# Patient Record
Sex: Female | Born: 1960 | Hispanic: No | Marital: Married | Smoking: Never smoker
Health system: Southern US, Community
[De-identification: ages and names within clinical notes are randomized; demographics above are authoritative.]

## PROBLEM LIST (undated history)

## (undated) DIAGNOSIS — I10 Essential (primary) hypertension: Secondary | ICD-10-CM

## (undated) DIAGNOSIS — E119 Type 2 diabetes mellitus without complications: Secondary | ICD-10-CM

---

## 2021-09-27 ENCOUNTER — Emergency Department (HOSPITAL_BASED_OUTPATIENT_CLINIC_OR_DEPARTMENT_OTHER): Payer: PRIVATE HEALTH INSURANCE

## 2021-09-27 ENCOUNTER — Other Ambulatory Visit: Payer: Self-pay

## 2021-09-27 ENCOUNTER — Encounter (HOSPITAL_BASED_OUTPATIENT_CLINIC_OR_DEPARTMENT_OTHER): Payer: Self-pay

## 2021-09-27 ENCOUNTER — Observation Stay (HOSPITAL_BASED_OUTPATIENT_CLINIC_OR_DEPARTMENT_OTHER)
Admission: EM | Admit: 2021-09-27 | Discharge: 2021-09-28 | Disposition: A | Discharge status: Home / Self Care | Payer: PRIVATE HEALTH INSURANCE | Attending: Internal Medicine | Admitting: Internal Medicine

## 2021-09-27 DIAGNOSIS — I639 Cerebral infarction, unspecified: Principal | ICD-10-CM | POA: Insufficient documentation

## 2021-09-27 DIAGNOSIS — Z7984 Long term (current) use of oral hypoglycemic drugs: Secondary | ICD-10-CM | POA: Insufficient documentation

## 2021-09-27 DIAGNOSIS — I10 Essential (primary) hypertension: Secondary | ICD-10-CM | POA: Insufficient documentation

## 2021-09-27 DIAGNOSIS — R55 Syncope and collapse: Secondary | ICD-10-CM | POA: Diagnosis present

## 2021-09-27 DIAGNOSIS — E039 Hypothyroidism, unspecified: Secondary | ICD-10-CM | POA: Diagnosis not present

## 2021-09-27 DIAGNOSIS — E1143 Type 2 diabetes mellitus with diabetic autonomic (poly)neuropathy: Secondary | ICD-10-CM | POA: Diagnosis not present

## 2021-09-27 DIAGNOSIS — Z20822 Contact with and (suspected) exposure to covid-19: Secondary | ICD-10-CM | POA: Diagnosis not present

## 2021-09-27 DIAGNOSIS — E119 Type 2 diabetes mellitus without complications: Secondary | ICD-10-CM | POA: Insufficient documentation

## 2021-09-27 DIAGNOSIS — E871 Hypo-osmolality and hyponatremia: Secondary | ICD-10-CM | POA: Insufficient documentation

## 2021-09-27 DIAGNOSIS — E11649 Type 2 diabetes mellitus with hypoglycemia without coma: Secondary | ICD-10-CM | POA: Diagnosis not present

## 2021-09-27 HISTORY — DX: Essential (primary) hypertension: I10

## 2021-09-27 HISTORY — DX: Type 2 diabetes mellitus without complications: E11.9

## 2021-09-27 LAB — COMPREHENSIVE METABOLIC PANEL
ALT: 15 U/L (ref 0–44)
AST: 22 U/L (ref 15–41)
Albumin: 4.2 g/dL (ref 3.5–5.0)
Alkaline Phosphatase: 89 U/L (ref 38–126)
Anion gap: 12 (ref 5–15)
BUN: 11 mg/dL (ref 8–23)
CO2: 21 mmol/L — ABNORMAL LOW (ref 22–32)
Calcium: 9.3 mg/dL (ref 8.9–10.3)
Chloride: 93 mmol/L — ABNORMAL LOW (ref 98–111)
Creatinine, Ser: 0.78 mg/dL (ref 0.44–1.00)
GFR, Estimated: >60 mL/min (ref >60)
Glucose, Bld: 244 mg/dL — ABNORMAL HIGH (ref 70–99)
Potassium: 4.1 mmol/L (ref 3.5–5.1)
Sodium: 126 mmol/L — ABNORMAL LOW (ref 135–145)
Total Bilirubin: 0.6 mg/dL (ref 0.3–1.2)
Total Protein: 7.8 g/dL (ref 6.5–8.1)

## 2021-09-27 LAB — URINALYSIS, ROUTINE W REFLEX MICROSCOPIC
Bilirubin Urine: NEGATIVE
Glucose, UA: 100 mg/dL — AB
Hgb urine dipstick: NEGATIVE
Ketones, ur: NEGATIVE mg/dL
Leukocytes,Ua: NEGATIVE
Nitrite: NEGATIVE
Protein, ur: NEGATIVE mg/dL
Specific Gravity, Urine: 1.015 (ref 1.005–1.030)
pH: 7.5 (ref 5.0–8.0)

## 2021-09-27 LAB — CBC WITH DIFFERENTIAL/PLATELET
Abs Immature Granulocytes: 0.04 10*3/uL (ref 0.00–0.07)
Basophils Absolute: 0 10*3/uL (ref 0.0–0.1)
Basophils Relative: 0 %
Eosinophils Absolute: 0 10*3/uL (ref 0.0–0.5)
Eosinophils Relative: 0 %
HCT: 35.5 % — ABNORMAL LOW (ref 36.0–46.0)
Hemoglobin: 11 g/dL — ABNORMAL LOW (ref 12.0–15.0)
Immature Granulocytes: 0 %
Lymphocytes Relative: 12 %
Lymphs Abs: 1.2 10*3/uL (ref 0.7–4.0)
MCH: 23.3 pg — ABNORMAL LOW (ref 26.0–34.0)
MCHC: 31 g/dL (ref 30.0–36.0)
MCV: 75.1 fL — ABNORMAL LOW (ref 80.0–100.0)
Monocytes Absolute: 0.4 10*3/uL (ref 0.1–1.0)
Monocytes Relative: 4 %
Neutro Abs: 8.3 10*3/uL — ABNORMAL HIGH (ref 1.7–7.7)
Neutrophils Relative %: 84 %
Platelets: 428 10*3/uL — ABNORMAL HIGH (ref 150–400)
RBC: 4.73 MIL/uL (ref 3.87–5.11)
RDW: 14.3 % (ref 11.5–15.5)
WBC: 10 10*3/uL (ref 4.0–10.5)
nRBC: 0 % (ref 0.0–0.2)

## 2021-09-27 LAB — RESP PANEL BY RT-PCR (FLU A&B, COVID) ARPGX2
Influenza A by PCR: NEGATIVE
Influenza B by PCR: NEGATIVE
SARS Coronavirus 2 by RT PCR: NEGATIVE

## 2021-09-27 LAB — TROPONIN I (HIGH SENSITIVITY)
Troponin I (High Sensitivity): 3 ng/L (ref <18)
Troponin I (High Sensitivity): 2 ng/L (ref <18)

## 2021-09-27 LAB — SODIUM, URINE, RANDOM: Sodium, Ur: 43 mmol/L

## 2021-09-27 LAB — CBG MONITORING, ED: Glucose-Capillary: 231 mg/dL — ABNORMAL HIGH (ref 70–99)

## 2021-09-27 LAB — HEMOGLOBIN A1C
Hgb A1c MFr Bld: 7.4 % — ABNORMAL HIGH (ref 4.8–5.6)
Mean Plasma Glucose: 165.68 mg/dL

## 2021-09-27 LAB — TSH: TSH: 1.49 u[IU]/mL (ref 0.350–4.500)

## 2021-09-27 LAB — OSMOLALITY, URINE: Osmolality, Ur: 135 mOsm/kg — ABNORMAL LOW (ref 300–900)

## 2021-09-27 LAB — GLUCOSE, CAPILLARY
Glucose-Capillary: 142 mg/dL — ABNORMAL HIGH (ref 70–99)
Glucose-Capillary: 182 mg/dL — ABNORMAL HIGH (ref 70–99)

## 2021-09-27 LAB — CREATININE, URINE, RANDOM: Creatinine, Urine: 12.54 mg/dL

## 2021-09-27 LAB — HIV ANTIBODY (ROUTINE TESTING W REFLEX): HIV Screen 4th Generation wRfx: NONREACTIVE

## 2021-09-27 LAB — OSMOLALITY: Osmolality: 278 mOsm/kg (ref 275–295)

## 2021-09-27 MED ORDER — AMLODIPINE BESYLATE 5 MG PO TABS
5.0000 mg | ORAL_TABLET | Freq: Every day | ORAL | Status: DC
  Administered 2021-09-27: 5 mg via ORAL
  Filled 2021-09-27 (×2): qty 1

## 2021-09-27 MED ORDER — DULOXETINE HCL 20 MG PO CPEP
20.0000 mg | ORAL_CAPSULE | Freq: Every day | ORAL | Status: DC
  Filled 2021-09-27 (×2): qty 1

## 2021-09-27 MED ORDER — SODIUM CHLORIDE 0.9% FLUSH
3.0000 mL | Freq: Two times a day (BID) | INTRAVENOUS | Status: DC
  Administered 2021-09-27 – 2021-09-28 (×2): 3 mL via INTRAVENOUS

## 2021-09-27 MED ORDER — ASPIRIN EC 81 MG PO TBEC
81.0000 mg | DELAYED_RELEASE_TABLET | Freq: Every day | ORAL | Status: DC
  Administered 2021-09-27 – 2021-09-28 (×2): 81 mg via ORAL
  Filled 2021-09-27 (×2): qty 1

## 2021-09-27 MED ORDER — LISINOPRIL 10 MG PO TABS
10.0000 mg | ORAL_TABLET | Freq: Every day | ORAL | Status: DC
  Administered 2021-09-27: 10 mg via ORAL
  Filled 2021-09-27 (×2): qty 1

## 2021-09-27 MED ORDER — METOCLOPRAMIDE HCL 10 MG PO TABS
5.0000 mg | ORAL_TABLET | Freq: Three times a day (TID) | ORAL | Status: DC
  Administered 2021-09-27 – 2021-09-28 (×3): 5 mg via ORAL
  Filled 2021-09-27 (×3): qty 1

## 2021-09-27 MED ORDER — INSULIN ASPART 100 UNIT/ML IJ SOLN
0.0000 [IU] | Freq: Three times a day (TID) | INTRAMUSCULAR | Status: DC
  Administered 2021-09-28: 2 [IU] via SUBCUTANEOUS
  Administered 2021-09-28: 5 [IU] via SUBCUTANEOUS

## 2021-09-27 MED ORDER — SODIUM CHLORIDE 0.9 % IV SOLN
Freq: Once | INTRAVENOUS | Status: AC
Start: 2021-09-27 — End: 2021-09-27

## 2021-09-27 MED ORDER — CARVEDILOL 3.125 MG PO TABS
3.1250 mg | ORAL_TABLET | Freq: Two times a day (BID) | ORAL | Status: DC
  Administered 2021-09-27: 3.125 mg via ORAL
  Filled 2021-09-27: qty 1

## 2021-09-27 MED ORDER — PANTOPRAZOLE SODIUM 40 MG PO TBEC
40.0000 mg | DELAYED_RELEASE_TABLET | Freq: Every day | ORAL | Status: DC
  Administered 2021-09-27 – 2021-09-28 (×2): 40 mg via ORAL
  Filled 2021-09-27 (×2): qty 1

## 2021-09-27 MED ORDER — DULOXETINE HCL 30 MG PO CPEP: 30.0000 mg | ORAL_CAPSULE | Freq: Every day | ORAL | Status: DC

## 2021-09-27 MED ORDER — ENOXAPARIN SODIUM 40 MG/0.4ML IJ SOSY
40.0000 mg | PREFILLED_SYRINGE | INTRAMUSCULAR | Status: DC
  Administered 2021-09-27: 40 mg via SUBCUTANEOUS
  Filled 2021-09-27: qty 0.4

## 2021-09-27 NOTE — H&P (Signed)
History and Physical    Sheryl Elliott WJX:[id number] DOB: [birth year] DOA: 09/27/2021  PCP: System, Provider Not In (Confirm with patient/family/NH records and if not entered, this has to be entered at [facility] point of entry) Patient coming from: Home  I have personally briefly reviewed patient's old medical records in [place]  Chief Complaint: Feeling better  HPI: Sheryl Elliott is a 61 y.o. female with medical history significant of IDDM, HTN, HLD, presented with episode of loss of consciousness.  Patient is Hindi speaking, with daughter at bedside translating.  Daughter reported the patient has had frequent episodes of near syncope and hypoglycemia in the mornings for last 2 to 53-month  As frequent as 2-3 times a week, when usually happens in the morning patient woke up and then complaining about palpitations and blurry vision, family sometimes will check fingerstick most occasions around 60s sometimes can be as low as 40s.  This morning, patient was waking up and before eating breakfast, started to feel lightheaded and then collapsed on the floor, loss of consciousness for about 10 seconds.  Daughter reported that patient appears pale, eyes were open and staring, and corner of the mouth was drooling.  No tongue biting, no loss control of urine or bowel movement.  Recovered consciousness spontaneously after about 10 seconds.  Daughter checked patient's blood pressure which measured SBP 180s high compared to her baseline.  Patient immediately drank a cup of juice and mentation came back to baseline. However family did not check her fingerstick this morning, so not sure about she was hypoglycemic.  At baseline, patient has longstanding history of diabetes, she [place] any [place]CP [place] she got all her diabetic medications from [place]  On her last visit 2021 to [place] her diabetes medication was cut down by her local doctor there.  Recently, patient has suffered from frequent feeling  of nausea and " fullness" every time she eats, for that she has been cutting down her meal size, and changed her eating pattern.  Despite, patient has been losing weight which family unsure about how much.  Family denied there is any new medication recently.  ED Course: Blood pressure elevated, borderline tachycardia.  CT head negative for acute findings, but was glucose 244, sodium 126.  Review of Systems: As per HPI otherwise 14 point review of systems negative.    Past Medical History:  Diagnosis Date   Diabetes mellitus without complication (HDawes    Hypertension        reports that she has never smoked. She has never used smokeless tobacco. She reports that she does not drink alcohol and does not use drugs.  No Known Allergies  No family history on file.   Prior to Admission medications   Medication Sig Start Date End Date Taking? Authorizing Provider  Choline Fenofibrate (FENOFIBRIC ACID) 135 MG CPDR Take 1 tablet by mouth daily.   Yes [provider]  DULoxetine (CYMBALTA) 20 MG capsule Take 20 mg by mouth daily.   Yes [provider]  metFORMIN (GLUCOPHAGE) 500 MG tablet Take 500 mg by mouth 2 (two) times daily with a meal.   Yes [provider]  pantoprazole (PROTONIX) 40 MG tablet Take 40 mg by mouth daily.   Yes [provider]  pioglitazone-metformin (ACTOPLUS MET) 15-500 MG tablet Take 1 tablet by mouth daily.   Yes [provider]  PRESCRIPTION MEDICATION Take 1 tablet by mouth daily. Medication: Voglibose and Metfromin: 0.3/500mg tablet   Yes [provider]  Telmisartan-amLODIPine 40-5 MG TABS Take 1 tablet by mouth daily.   Yes [provider]    Physical Exam: Vitals:   09/27/21 1437 09/27/21 1500 09/27/21 1629 09/27/21 1646  BP:  139/70 (!) 153/77   Pulse:  96 91   Resp:  18 20   Temp:   98.9 F (37.2 C)   TempSrc:   Oral Oral  SpO2:  100% 100%   Weight:      Height: '5\' 6"'  (1.676 m)        Constitutional: NAD, calm, comfortable Vitals:   09/27/21 1437 09/27/21 1500 09/27/21 1629 09/27/21 1646  BP:  139/70 (!) 153/77   Pulse:  96 91   Resp:  18 20   Temp:   98.9 F (37.2 C)   TempSrc:   Oral Oral  SpO2:  100% 100%   Weight:      Height: '5\' 6"'  (1.676 m)      Eyes: PERRL, lids and conjunctivae normal ENMT: Mucous membranes are moist. Posterior pharynx clear of any exudate or lesions.Normal dentition.  Neck: normal, supple, no masses, no thyromegaly Respiratory: clear to auscultation bilaterally, no wheezing, no crackles. Normal respiratory effort. No accessory muscle use.  Cardiovascular: Regular rate and rhythm, no murmurs / rubs / gallops. No extremity edema. 2+ pedal pulses. No carotid bruits.  Abdomen: no tenderness, no masses palpated. No hepatosplenomegaly. Bowel sounds positive.  Musculoskeletal: no clubbing / cyanosis. No joint deformity upper and lower extremities. Good ROM, no contractures. Normal muscle tone.  Skin: no rashes, lesions, ulcers. No induration Neurologic: CN 2-12 grossly intact. Sensation intact, DTR normal. Strength 5/5 in all 4.  Psychiatric: Normal judgment and insight. Alert and oriented x 3. Normal mood.     Labs on Admission: I have personally reviewed following labs and imaging studies  CBC: Recent Labs  Lab 09/27/21 1251  WBC 10.0  NEUTROABS 8.3*  HGB 11.0*  HCT 35.5*  MCV 75.1*  PLT 622*   Basic Metabolic Panel: Recent Labs  Lab 09/27/21 1251  NA 126*  K 4.1  CL 93*  CO2 21*  GLUCOSE 244*  BUN 11  CREATININE 0.78  CALCIUM 9.3   GFR: Estimated Creatinine Clearance: 68.3 mL/min (by C-G formula based on SCr of 0.78 mg/dL). Liver Function Tests: Recent Labs  Lab 09/27/21 1251  AST 22  ALT 15  ALKPHOS 89  BILITOT 0.6  PROT 7.8  ALBUMIN 4.2   No results for input(s): LIPASE, AMYLASE in the last 168 hours. No results for input(s): AMMONIA in the last 168 hours. Coagulation Profile: No results for  input(s): INR, PROTIME in the last 168 hours. Cardiac Enzymes: No results for input(s): CKTOTAL, CKMB, CKMBINDEX, TROPONINI in the last 168 hours. BNP (last 3 results) No results for input(s): PROBNP in the last 8760 hours. HbA1C: No results for input(s): HGBA1C in the last 72 hours. CBG: Recent Labs  Lab 09/27/21 1250 09/27/21 1717  GLUCAP 231* 142*   Lipid Profile: No results for input(s): CHOL, HDL, LDLCALC, TRIG, CHOLHDL, LDLDIRECT in the last 72 hours. Thyroid Function Tests: No results for input(s): TSH, T4TOTAL, FREET4, T3FREE, THYROIDAB in the last 72 hours. Anemia Panel: No results for input(s): VITAMINB12, FOLATE, FERRITIN, TIBC, IRON, RETICCTPCT in the last 72 hours. Urine analysis:    Component Value Date/Time   COLORURINE STRAW (A) 09/27/2021 1252   APPEARANCEUR CLEAR 09/27/2021 1252   LABSPEC 1.015 09/27/2021 1252   PHURINE 7.5 09/27/2021 1252   GLUCOSEU 100 (A)  09/27/2021 [place] 09/27/2021 1252   BILIRUBINUR NEGATIVE 09/27/2021 1252   KETONESUR NEGATIVE 09/27/2021 1252   PROTEINUR NEGATIVE 09/27/2021 1252   NITRITE NEGATIVE 09/27/2021 1252   LEUKOCYTESUR NEGATIVE 09/27/2021 1252    Radiological Exams on Admission: CT HEAD WO CONTRAST  Result Date: 09/27/2021 CLINICAL DATA:  Syncope, possible TIA, lightheaded, dizziness EXAM: CT HEAD WITHOUT CONTRAST TECHNIQUE: Contiguous axial images were obtained from the base of the skull through the vertex without intravenous contrast. RADIATION DOSE REDUCTION: This exam was performed according to the departmental dose-optimization program which includes automated exposure control, adjustment of the mA and/or kV according to patient size and/or use of iterative reconstruction technique. COMPARISON:  None. FINDINGS: Brain: There is no evidence of acute intracranial hemorrhage, extra-axial fluid collection, or acute infarct. Parenchymal volume is normal. The ventricles are normal in size. There is no mass lesion.  There is no midline shift. Vascular: No hyperdense vessel or unexpected calcification. Skull: Normal. Negative for fracture or focal lesion. 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 The imaged paranasal sinuses are otherwise clear. The imaged globes and orbits are unremarkable. Other: The mastoid air cells are clear. IMPRESSION: Normal head CT. Electronically Signed   By: Valetta Mole M.D.   On: 09/27/2021 13:54   DG Chest Port 1 View  Result Date: 09/27/2021 CLINICAL DATA:  Syncope. EXAM: PORTABLE CHEST 1 VIEW COMPARISON:  None. FINDINGS: The heart size and mediastinal contours are within normal limits. Both lungs are clear. The visualized skeletal structures are unremarkable. IMPRESSION: No active disease. Electronically Signed   By: Marijo Conception M.D.   On: 09/27/2021 14:29    EKG: Independently reviewed.  Sinus, no acute ST changes.  Assessment/Plan Principal Problem:   Acute ischemic stroke [facility]) Active Problems:   Syncope  (please populate well all problems here in Problem List. (For example, if patient is on BP meds at home and you resume or decide to hold them, it is a problem that needs to be her. Same for CAD, COPD, HLD and so on)  Syncope -Likely secondary to hypoglycemia. -Suspect patient has over stringent glucose control.  Family showed me patient at least taking 3 diabetic medication including metformin, Prandin and pioglitazone. -Check A1c, will hold off p.o. diabetic medication for now. -Other DDx, pseudoseizure likely from hypoglycemia, low suspicion for seizure disorder.  Neuro exam nonfocal, and family did not notice any significant one-sided weakness of the patient recovered consciousness, stroke unlikely. Other etiologies such as arrhythmia less likely.  Outpatient cardiac work-up if symptoms persists when hypoglycemia resolved.  Hyponatremia -Euvolemic, patient probably taking HCTZ, currently family does not bring in patient's HTN  medications. -Check hyponatremia study -Hold off HCTZ -We will use Coreg and lisinopril to control blood pressure.  Diabetic gastroparesis -Clinically her symptoms compatible with gastroparesis -Trial of Reglan 4 times daily x2 weeks. -Patient plans to go back to [place] in 2 weeks, recommend she go to see a GI doctor at [place] for EGD versus gastric emptying study.  Explained to daughter who expressed understanding and agreed.  DVT prophylaxis: Lovenox Code Status: Full code Family Communication: Daughter at bedside Disposition Plan: Expect less than 2 midnight hospital stay Consults called: None Admission status: Telemetry observation   Lequita Halt MD [facility] Pager [phone]  09/27/2021, 6:02 PM

## 2021-09-27 NOTE — ED Notes (Signed)
ED Provider at bedside. 

## 2021-09-27 NOTE — ED Provider Notes (Signed)
[facility] EMERGENCY DEPARTMENT Provider Note   CSN: [id number] Arrival date & time: 09/27/21  1226     History  Chief Complaint  Patient presents with   Loss of Consciousness    Sheryl Elliott is a 61 y.o. female.  Patient arrives after having syncopal event earlier this morning around 845.  Family states that patient suddenly felt lightheaded and passed out on the ground.  She was unresponsive for about 15 seconds.  No seizure-like activity.  Concerned that blood sugar might have been low but was not.  Patient has had issues with blood sugars in the past.  After patient woke up family member thought maybe the left side of the face was droopy for a little bit but resolved fairly quickly.  She has been at her baseline ever since.  She had an episode of some chest discomfort yesterday.  Denies any shortness of breath.    The history is provided by the patient.  Loss of Consciousness Episode history:  Single Most recent episode:  Today Duration:  15 seconds Progression:  Resolved Chronicity:  New Context: normal activity   Witnessed: yes   Relieved by:  Nothing Worsened by:  Nothing Associated symptoms: chest pain (last night) and dizziness   Associated symptoms: no anxiety, no confusion, no diaphoresis, no difficulty breathing, no fever, no focal sensory loss, no focal weakness and no headaches   Risk factors comment:  Hypertension, high cholesterol, diabetes on metformin     Home Medications Prior to Admission medications   Not on File      Allergies    Patient has no known allergies.    Review of Systems   Review of Systems  Constitutional:  Negative for diaphoresis and fever.  Cardiovascular:  Positive for chest pain (last night) and syncope.  Neurological:  Positive for dizziness. Negative for focal weakness and headaches.  Psychiatric/Behavioral:  Negative for confusion.    Physical Exam Updated Vital Signs  ED Triage Vitals  Enc Vitals Group      BP 09/27/21 1241 (!) 171/77     Pulse Rate 09/27/21 1241 (!) 109     Resp 09/27/21 1238 17     Temp 09/27/21 1238 98.5 F (36.9 C)     Temp src --      SpO2 09/27/21 1238 99 %     Weight 09/27/21 1240 137 lb 3.2 oz (62.2 kg)     Height --      Head Circumference --      Peak Flow --      Pain Score 09/27/21 1241 0     Pain Loc --      Pain Edu? --      Excl. in GC? --     Physical Exam Vitals and nursing note reviewed.  Constitutional:      General: She is not in acute distress.    Appearance: She is well-developed. She is not ill-appearing.  HENT:     Head: Normocephalic and atraumatic.     Nose: Nose normal.     Mouth/Throat:     Mouth: Mucous membranes are moist.  Eyes:     Extraocular Movements: Extraocular movements intact.     Conjunctiva/sclera: Conjunctivae normal.     Pupils: Pupils are equal, round, and reactive to light.  Cardiovascular:     Rate and Rhythm: Normal rate and regular rhythm.     Pulses: Normal pulses.     Heart sounds: Normal heart sounds. No murmur heard.  Pulmonary:     Effort: Pulmonary effort is normal. No respiratory distress.     Breath sounds: Normal breath sounds.  Abdominal:     Palpations: Abdomen is soft.     Tenderness: There is no abdominal tenderness.  Musculoskeletal:        General: No swelling. Normal range of motion.     Cervical back: Normal range of motion and neck supple.  Skin:    General: Skin is warm and dry.     Capillary Refill: Capillary refill takes less than 2 seconds.  Neurological:     General: No focal deficit present.     Mental Status: She is alert and oriented to person, place, and time.     Cranial Nerves: No cranial nerve deficit.     Sensory: No sensory deficit.     Motor: No weakness.     Coordination: Coordination normal.     Comments: 5+ out of 5 strength throughout, normal sensation, no drift, normal finger-nose-finger, normal speech, no droop, normal visual fields  Psychiatric:        Mood and  Affect: Mood normal.    ED Results / Procedures / Treatments   Labs (all labs ordered are listed, but only abnormal results are displayed) Labs Reviewed  CBC WITH DIFFERENTIAL/PLATELET - Abnormal; Notable for the following components:      Result Value   Hemoglobin 11.0 (*)    HCT 35.5 (*)    MCV 75.1 (*)    MCH 23.3 (*)    Platelets 428 (*)    Neutro Abs 8.3 (*)    All other components within normal limits  COMPREHENSIVE METABOLIC PANEL - Abnormal; Notable for the following components:   Sodium 126 (*)    Chloride 93 (*)    CO2 21 (*)    Glucose, Bld 244 (*)    All other components within normal limits  CBG MONITORING, ED - Abnormal; Notable for the following components:   Glucose-Capillary 231 (*)    All other components within normal limits  RESP PANEL BY RT-PCR (FLU A&B, COVID) [id number]  URINALYSIS, ROUTINE W REFLEX MICROSCOPIC  TROPONIN I (HIGH SENSITIVITY)    EKG EKG Interpretation  Date/Time:  Thursday September 27 2021 12:39:20 EST Ventricular Rate:  105 PR Interval:  120 QRS Duration: 94 QT Interval:  353 QTC Calculation: 467 R Axis:   99 Text Interpretation: Sinus tachycardia Right axis deviation Confirmed by Virgina Norfolk ([id number]) on 09/27/2021 12:49:29 PM  Radiology CT HEAD WO CONTRAST  Result Date: 09/27/2021 CLINICAL DATA:  Syncope, possible TIA, lightheaded, dizziness EXAM: CT HEAD WITHOUT CONTRAST TECHNIQUE: Contiguous axial images were obtained from the base of the skull through the vertex without intravenous contrast. RADIATION DOSE REDUCTION: This exam was performed according to the departmental dose-optimization program which includes automated exposure control, adjustment of the mA and/or kV according to patient size and/or use of iterative reconstruction technique. COMPARISON:  None. FINDINGS: Brain: There is no evidence of acute intracranial hemorrhage, extra-axial fluid collection, or acute infarct. Parenchymal volume is normal. The ventricles are normal  in size. There is no mass lesion. There is no midline shift. Vascular: No hyperdense vessel or unexpected calcification. Skull: Normal. Negative for fracture or focal lesion. 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 The imaged paranasal sinuses are otherwise clear. The imaged globes and orbits are unremarkable. Other: The mastoid air cells are clear. IMPRESSION: Normal head CT. Electronically Signed   By: Selena Lesser.D.  On: 09/27/2021 13:54    Procedures Procedures    Medications Ordered in ED Medications  0.9 %  sodium chloride infusion (has no administration in time range)    ED Course/ Medical Decision Making/ A&P                           Medical Decision Making  Michaelann Allena Katzatel is here after syncopal event.  Patient with history of high cholesterol, diabetes, hypertension.  Patient has syncopal event about 4 hours ago.  She got lightheaded and passed out.  Did not think that she hit her head.  This was witnessed.  Maybe some left-sided facial droop after this occurred but resolved fairly quickly.  Has been back to her baseline since.  She has some chest pain last night.  Does not have any active symptoms right now.  No chest pain or shortness of breath.  According to family she has had some issues with her blood sugar.  She is currently visiting family.  Patient is not on insulin.  She takes metformin.  She overall appears well.  Neurologically she is intact.  Somewhat unprovoked syncopal event.  Differential diagnosis is cardiac arrhythmia, less likely ACS, TIA, electrolyte abnormality, valvular heart disease.  We will get lab work including troponin, CT of head.  Anticipate admission for further work-up.  2:03 PM lab work and imaging have been reviewed and interpreted.  No significant anemia or leukocytosis.  Blood sugar elevated to 244 but no evidence of DKA.  Sodium is 126.  Troponin within normal limits.  COVID and flu test are negative.  CT scan of the head  is unremarkable.  Overall will admit for further syncope work-up.  Concern for possible arrhythmia, valvular process, TIA.  Will admit to medicine for further care.  This chart was dictated using voice recognition software.  Despite best efforts to proofread,  errors can occur which can change the documentation meaning.         Final Clinical Impression(s) / ED Diagnoses Final diagnoses:  Syncope and collapse    Rx / DC Orders ED Discharge Orders     None         Virgina NorfolkCuratolo, Nijee Heatwole, DO 09/27/21 1403

## 2021-09-27 NOTE — ED Notes (Signed)
Daughter Jarvis Newcomer [phone] Family Member/Translator if needed. Please call with updates on Pt.

## 2021-09-27 NOTE — Plan of Care (Signed)
Patient admitted from [facility] med center. AAOx4 non English speaking . Daughter in law by bedside. Cardiac monitor #1 RA

## 2021-09-27 NOTE — ED Notes (Signed)
Pt laid flat in preparation for orthostatic VS 

## 2021-09-27 NOTE — ED Notes (Signed)
Pt given ice water per EDP verbal approval.

## 2021-09-27 NOTE — ED Triage Notes (Addendum)
Pt arrives with family. Family states this morning around 0845 the patient became light headed, dizzy, and told family she felt like her sugar was low. Pt then passed out on the floor.daughter states she noticed some left sided facial droop that lasted for about 15 seconds after the patient became unresponsive. Daughter gave the patient water, then some food and she was feeling better shortly after.  No injury. Pt is alert and oriented x4.

## 2021-09-28 DIAGNOSIS — R55 Syncope and collapse: Secondary | ICD-10-CM

## 2021-09-28 LAB — BASIC METABOLIC PANEL
Anion gap: 8 (ref 5–15)
BUN: 9 mg/dL (ref 8–23)
CO2: 24 mmol/L (ref 22–32)
Calcium: 8.9 mg/dL (ref 8.9–10.3)
Chloride: 102 mmol/L (ref 98–111)
Creatinine, Ser: 0.75 mg/dL (ref 0.44–1.00)
GFR, Estimated: >60 mL/min (ref >60)
Glucose, Bld: 118 mg/dL — ABNORMAL HIGH (ref 70–99)
Potassium: 3.7 mmol/L (ref 3.5–5.1)
Sodium: 134 mmol/L — ABNORMAL LOW (ref 135–145)

## 2021-09-28 LAB — LIPID PANEL
Cholesterol: 155 mg/dL (ref 0–200)
HDL: 84 mg/dL (ref >40)
LDL Cholesterol: 61 mg/dL (ref 0–99)
Total CHOL/HDL Ratio: 1.8 RATIO
Triglycerides: 51 mg/dL (ref <150)
VLDL: 10 mg/dL (ref 0–40)

## 2021-09-28 LAB — GLUCOSE, CAPILLARY
Glucose-Capillary: 121 mg/dL — ABNORMAL HIGH (ref 70–99)
Glucose-Capillary: 208 mg/dL — ABNORMAL HIGH (ref 70–99)

## 2021-09-28 MED ORDER — METFORMIN HCL 500 MG PO TABS: ORAL_TABLET | ORAL | 0 refills | Status: AC

## 2021-09-28 MED ORDER — PANTOPRAZOLE SODIUM 40 MG PO TBEC: 40.0000 mg | DELAYED_RELEASE_TABLET | Freq: Every day | ORAL | 0 refills | Status: AC

## 2021-09-28 MED ORDER — SODIUM CHLORIDE 0.9 % IV SOLN: INTRAVENOUS | Status: DC

## 2021-09-28 MED ORDER — TELMISARTAN 40 MG PO TABS: 40.0000 mg | ORAL_TABLET | Freq: Every day | ORAL | 0 refills | Status: AC

## 2021-09-28 NOTE — Progress Notes (Signed)
Discharge instructions reviewed with pt's son and pt. Copy of instructions and scripts given to pt.  Pt d/c'd via wheelchair with belongings, with son.            Escorted by unit staff.

## 2021-09-28 NOTE — Progress Notes (Signed)
Dr Waldron [facility] notified of the following,  SBP 90's-100's  this am, norvasc or lisinopril not given this am due to low BPs. Ortho VS taken as requested this am and documented on the VS flowsheet.   Also pt/family would like to talk with MD about the cymbalta med order....pt refused to take last night and  this morning..  pt and family state she is not depressed and didn't feel they needed to take the med.      IVF started per orders.

## 2021-09-28 NOTE — TOC Transition Note (Signed)
Transition of Care [facility]) - CM/SW Discharge Note   Patient Details  Name: Sheryl Elliott MRN: [id number] Date of Birth: 10-28-1960  [facility] of [facility]) CM/SW Contact:  Kermit Balo, RN Phone Number: 09/28/2021, 2:19 PM   Clinical Narrative:    Patient is discharging home today. The plan is for her to return to [place] in 2 weeks. MD provided pt with Good Rx cards to assist with the cost of discharge medications. Pt has supervision at home and transportation to home.    Final next level of care: Home/Self Care Barriers to Discharge: No Barriers Identified   Patient Goals and CMS Choice        Discharge Placement                       Discharge Plan and Services                                     Social Determinants of Health (SDOH) Interventions     Readmission Risk Interventions No flowsheet data found.

## 2021-09-28 NOTE — Discharge Instructions (Signed)
Follow with Primary MD  Activity: As tolerated with Full fall precautions use walker/cane & assistance as needed   Disposition Home    Diet: Heart Healthy /low salt/carb modified  On your next visit with your primary care physician please Get Medicines reviewed and adjusted.   Please request your Prim.MD to go over all Hospital Tests and Procedure/Radiological results at the follow up, please get all Hospital records sent to your Prim MD by signing hospital release before you go home.   If you experience worsening of your admission symptoms, develop shortness of breath, life threatening emergency, suicidal or homicidal thoughts you must seek medical attention immediately by calling 911 or calling your MD immediately  if symptoms less severe.  You Must read complete instructions/literature along with all the possible adverse reactions/side effects for all the Medicines you take and that have been prescribed to you. Take any new Medicines after you have completely understood and accpet all the possible adverse reactions/side effects.   Do not drive when taking Pain medications.    Do not take more than prescribed Pain, Sleep and Anxiety Medications  Special Instructions: If you have smoked or chewed Tobacco  in the last 2 yrs please stop smoking, stop any regular Alcohol  and or any Recreational drug use.  Wear Seat belts while driving.   Please note  You were cared for by a hospitalist during your hospital stay. If you have any questions about your discharge medications or the care you received while you were in the hospital after you are discharged, you can call the unit and asked to speak with the hospitalist on call if the hospitalist that took care of you is not available. Once you are discharged, your primary care physician will handle any further medical issues. Please note that NO REFILLS for any discharge medications will be authorized once you are discharged, as it is  imperative that you return to your primary care physician (or establish a relationship with a primary care physician if you do not have one) for your aftercare needs so that they can reassess your need for medications and monitor your lab values.

## 2021-09-28 NOTE — Discharge Summary (Signed)
Physician Discharge Summary  Sheryl Elliott DPO:[id number] DOB: December 14, 1960 DOA: 09/27/2021  PCP: System, Provider Not In  Admit date: 09/27/2021 Discharge date: 09/28/2021  Admitted From: Home Disposition:  Home   Recommendations for Outpatient Follow-up:  Follow up with PCP  Monitor CBG closely and adjust medication as needed, as it does appear patient has been on multiple oral hypoglycemic agents Monitor blood pressure closely and adjust medication as needed.:   Discharge Condition:Stable CODE STATUS:FULL Diet recommendation: Heart Healthy / Carb Modified    Brief/Interim Summary:  Syncope -This is secondary to hypoglycemia, no significant events on telemetry, CT head with no acute finding, TSH within normal limit, she is currently asymptomatic, not orthostatic.  Diabetes mellitus, well controlled, with recurrent hypoglycemia -On multiple oral hypoglycemic agents, she is on Pioglitazone/metformin,  Voglibose/metformin, glimepiride/metformin (I have confirmed she is on all these 3 medications as she has actual packages at bedside), her A1c is acceptable at 7.4, I have discussed with the son, patient has a history of multiple hypoglycemic events in the past, I think this is currently to multiple medications, I have instructed the son to discontinue we will give bolus and glimepiride, to continue with pioglitazone and metformin for now, I have given him prescription for metformin only to take at suppertime  Hypertension -Blood pressure has been soft overall, but she was not orthostatic, so I have recommended she to continue only on telmisartan and to discontinue amlodipine, I have given them prescription for telmisartan, son will buy blood pressure machine monitor and will monitor frequently.  Hyponatremia -Resolved with IV hydration   Hypo-thyroidism -TSH within normal limit, patient on Synthroid 12.5 mcg daily, she was instructed to continue   Discharge Diagnoses:  Active  Problems:   Syncope    Discharge Instructions  Discharge Instructions     Diet - low sodium heart healthy   Complete by: As directed    Discharge instructions   Complete by: As directed    Follow with Primary MD  Activity: As tolerated with Full fall precautions use walker/cane & assistance as needed   Disposition Home    Diet: Heart Healthy /low salt/carb modified  On your next visit with your primary care physician please Get Medicines reviewed and adjusted.   Please request your Prim.MD to go over all Hospital Tests and Procedure/Radiological results at the follow up, please get all Hospital records sent to your Prim MD by signing hospital release before you go home.   If you experience worsening of your admission symptoms, develop shortness of breath, life threatening emergency, suicidal or homicidal thoughts you must seek medical attention immediately by calling 911 or calling your MD immediately  if symptoms less severe.  You Must read complete instructions/literature along with all the possible adverse reactions/side effects for all the Medicines you take and that have been prescribed to you. Take any new Medicines after you have completely understood and accpet all the possible adverse reactions/side effects.   Do not drive when taking Pain medications.    Do not take more than prescribed Pain, Sleep and Anxiety Medications  Special Instructions: If you have smoked or chewed Tobacco  in the last 2 yrs please stop smoking, stop any regular Alcohol  and or any Recreational drug use.  Wear Seat belts while driving.   Please note  You were cared for by a hospitalist during your hospital stay. If you have any questions about your discharge medications or the care you received while you were in the hospital  after you are discharged, you can call the unit and asked to speak with the hospitalist on call if the hospitalist that took care of you is not available. Once you  are discharged, your primary care physician will handle any further medical issues. Please note that NO REFILLS for any discharge medications will be authorized once you are discharged, as it is imperative that you return to your primary care physician (or establish a relationship with a primary care physician if you do not have one) for your aftercare needs so that they can reassess your need for medications and monitor your lab values.   Increase activity slowly   Complete by: As directed       Allergies as of 09/28/2021   No Known Allergies      Medication List     STOP taking these medications    PRESCRIPTION MEDICATION   Telmisartan-amLODIPine 40-5 MG Tabs       TAKE these medications    DULoxetine 20 MG capsule Commonly known as: CYMBALTA Take 20 mg by mouth daily.   Fenofibric Acid 135 MG Cpdr Take 1 tablet by mouth daily.   metFORMIN 500 MG tablet Commonly known as: GLUCOPHAGE Please take 500 mg before breakfast, and 1000 mg before supper. What changed:  how much to take how to take this when to take this additional instructions   pantoprazole 40 MG tablet Commonly known as: PROTONIX Take 1 tablet (40 mg total) by mouth daily.   pioglitazone-metformin 15-500 MG tablet Commonly known as: ACTOPLUS MET Take 1 tablet by mouth daily.   telmisartan 40 MG tablet Commonly known as: MICARDIS Take 1 tablet (40 mg total) by mouth daily.        No Known Allergies  Consultations: none   Procedures/Studies: CT HEAD WO CONTRAST  Result Date: 09/27/2021 CLINICAL DATA:  Syncope, possible TIA, lightheaded, dizziness EXAM: CT HEAD WITHOUT CONTRAST TECHNIQUE: Contiguous axial images were obtained from the base of the skull through the vertex without intravenous contrast. RADIATION DOSE REDUCTION: This exam was performed according to the departmental dose-optimization program which includes automated exposure control, adjustment of the mA and/or kV according to  patient size and/or use of iterative reconstruction technique. COMPARISON:  None. FINDINGS: Brain: There is no evidence of acute intracranial hemorrhage, extra-axial fluid collection, or acute infarct. Parenchymal volume is normal. The ventricles are normal in size. There is no mass lesion. There is no midline shift. Vascular: No hyperdense vessel or unexpected calcification. Skull: Normal. Negative for fracture or focal lesion. 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 The imaged paranasal sinuses are otherwise clear. The imaged globes and orbits are unremarkable. Other: The mastoid air cells are clear. IMPRESSION: Normal head CT. Electronically Signed   By: Valetta Mole M.D.   On: 09/27/2021 13:54   DG Chest Port 1 View  Result Date: 09/27/2021 CLINICAL DATA:  Syncope. EXAM: PORTABLE CHEST 1 VIEW COMPARISON:  None. FINDINGS: The heart size and mediastinal contours are within normal limits. Both lungs are clear. The visualized skeletal structures are unremarkable. IMPRESSION: No active disease. Electronically Signed   By: Marijo Conception M.D.   On: 09/27/2021 14:29      Subjective:  No significant events overnight as discussed with staff, she denies any chest pain, shortness of breath, or any focal deficits. Discharge Exam: Vitals:   09/28/21 1153 09/28/21 1300  BP: 117/60   Pulse: 76   Resp:  18  Temp: 98.7 F (37.1 C)  SpO2: 100%   ° °Vitals:  ° 09/28/21 0815 09/28/21 0817 09/28/21 1153 09/28/21 1300  °BP: 97/67 107/73 117/60   °Pulse: 82 (!) 102 76   °Resp:    18  °Temp:   98.7 °F (37.1 °C)   °TempSrc:   Oral   °SpO2: 100% 100% 100%   °Weight:      °Height:      ° ° °General: Pt is alert, awake, not in acute distress °Cardiovascular: RRR, S1/S2 +, no rubs, no gallops °Respiratory: CTA bilaterally, no wheezing, no rhonchi °Abdominal: Soft, NT, ND, bowel sounds + °Extremities: no edema, no cyanosis ° ° ° °The results of significant diagnostics from this hospitalization  (including imaging, microbiology, ancillary and laboratory) are listed below for reference.   ° ° °Microbiology: °Recent Results (from the past 240 hour(s))  °Resp Panel by RT-PCR (Flu A&B, Covid) Nasopharyngeal Swab     Status: None  ° Collection Time: 09/27/21 12:51 PM  ° Specimen: Nasopharyngeal Swab; Nasopharyngeal(NP) swabs in vial transport medium  °Result Value Ref Range Status  ° SARS Coronavirus 2 by RT PCR NEGATIVE NEGATIVE Final  °  Comment: (NOTE) °SARS-CoV-2 target nucleic acids are NOT DETECTED. ° °The SARS-CoV-2 RNA is generally detectable in upper respiratory °specimens during the acute phase of infection. The lowest °concentration of SARS-CoV-2 viral copies this assay can detect is °138 copies/mL. A negative result does not preclude SARS-Cov-2 °infection and should not be used as the sole basis for treatment or °other patient management decisions. A negative result may occur with  °improper specimen collection/handling, submission of specimen other °than nasopharyngeal swab, presence of viral mutation(s) within the °areas targeted by this assay, and inadequate number of viral °copies(<138 copies/mL). A negative result must be combined with °clinical observations, patient history, and epidemiological °information. The expected result is Negative. ° °Fact Sheet for Patients:  °https://www.fda.gov/media/152166/download ° °Fact Sheet for Healthcare Providers:  °https://www.fda.gov/media/152162/download ° °This test is no t yet approved or cleared by the [place] FDA and  °has been authorized for detection and/or diagnosis of SARS-CoV-2 by °FDA under an Emergency Use Authorization (EUA). This EUA will remain  °in effect (meaning this test can be used) for the duration of the °COVID-19 declaration under Section 564(b)(1) of the Act, 21 °U.S.C.section 360bbb-3(b)(1), unless the authorization is terminated  °or revoked sooner.  ° ° °  ° Influenza A by PCR NEGATIVE NEGATIVE Final  ° Influenza B by PCR  NEGATIVE NEGATIVE Final  °  Comment: (NOTE) °The Xpert Xpress SARS-CoV-2/FLU/RSV plus assay is intended as an aid °in the diagnosis of influenza from Nasopharyngeal swab specimens and °should not be used as a sole basis for treatment. Nasal washings and °aspirates are unacceptable for Xpert Xpress SARS-CoV-2/FLU/RSV °testing. ° °Fact Sheet for Patients: °https://www.fda.gov/media/152166/download ° °Fact Sheet for Healthcare Providers: °https://www.fda.gov/media/152162/download ° °This test is not yet approved or cleared by the [place] FDA and °has been authorized for detection and/or diagnosis of SARS-CoV-2 by °FDA under an Emergency Use Authorization (EUA). This EUA will remain °in effect (meaning this test can be used) for the duration of the °COVID-19 declaration under Section 564(b)(1) of the Act, 21 U.S.C. °section 360bbb-3(b)(1), unless the authorization is terminated or °revoked. ° °Performed at [facility] [place], [address] [place], [place] [address] °  °  ° °Labs: °BNP (last 3 results) °No results for input(s): BNP in the last 8760 hours. °Basic Metabolic Panel: °Recent Labs  °Lab 09/27/21 °1251 09/28/21 °0133  °NA   126* 134*  °K 4.1 3.7  °CL 93* 102  °CO2 21* 24  °GLUCOSE 244* 118*  °BUN 11 9  °CREATININE 0.78 0.75  °CALCIUM 9.3 8.9  ° °Liver Function Tests: °Recent Labs  °Lab 09/27/21 °1251  °AST 22  °ALT 15  °ALKPHOS 89  °BILITOT 0.6  °PROT 7.8  °ALBUMIN 4.2  ° °No results for input(s): LIPASE, AMYLASE in the last 168 hours. °No results for input(s): AMMONIA in the last 168 hours. °CBC: °Recent Labs  °Lab 09/27/21 °1251  °WBC 10.0  °NEUTROABS 8.3*  °HGB 11.0*  °HCT 35.5*  °MCV 75.1*  °PLT 428*  ° °Cardiac Enzymes: °No results for input(s): CKTOTAL, CKMB, CKMBINDEX, TROPONINI in the last 168 hours. °BNP: °Invalid input(s): POCBNP °CBG: °Recent Labs  °Lab 09/27/21 °1250 09/27/21 °1717 09/27/21 °2137 09/28/21 °0622 09/28/21 °1154  °GLUCAP 231* 142* 182* 121* 208*  ° °D-Dimer °No results for  input(s): DDIMER in the last 72 hours. °Hgb A1c °Recent Labs  °  09/27/21 °1925  °HGBA1C 7.4*  ° °Lipid Profile °Recent Labs  °  09/28/21 °0133  °CHOL 155  °HDL 84  °LDLCALC 61  °TRIG 51  °CHOLHDL 1.8  ° °Thyroid function studies °Recent Labs  °  09/27/21 °1925  °TSH 1.490  ° °Anemia work up °No results for input(s): VITAMINB12, FOLATE, FERRITIN, TIBC, IRON, RETICCTPCT in the last 72 hours. °Urinalysis °   °Component Value Date/Time  ° COLORURINE STRAW (A) 09/27/2021 1252  ° APPEARANCEUR CLEAR 09/27/2021 1252  ° LABSPEC 1.015 09/27/2021 1252  ° PHURINE 7.5 09/27/2021 1252  ° GLUCOSEU 100 (A) 09/27/2021 1252  ° HGBUR NEGATIVE 09/27/2021 1252  ° BILIRUBINUR NEGATIVE 09/27/2021 1252  ° KETONESUR NEGATIVE 09/27/2021 1252  ° PROTEINUR NEGATIVE 09/27/2021 1252  ° NITRITE NEGATIVE 09/27/2021 1252  ° LEUKOCYTESUR NEGATIVE 09/27/2021 1252  ° °Sepsis Labs °Invalid input(s): PROCALCITONIN,  WBC,  LACTICIDVEN °Microbiology °Recent Results (from the past 240 hour(s))  °Resp Panel by RT-PCR (Flu A&B, Covid) Nasopharyngeal Swab     Status: None  ° Collection Time: 09/27/21 12:51 PM  ° Specimen: Nasopharyngeal Swab; Nasopharyngeal(NP) swabs in vial transport medium  °Result Value Ref Range Status  ° SARS Coronavirus 2 by RT PCR NEGATIVE NEGATIVE Final  °  Comment: (NOTE) °SARS-CoV-2 target nucleic acids are NOT DETECTED. ° °The SARS-CoV-2 RNA is generally detectable in upper respiratory °specimens during the acute phase of infection. The lowest °concentration of SARS-CoV-2 viral copies this assay can detect is °138 copies/mL. A negative result does not preclude SARS-Cov-2 °infection and should not be used as the sole basis for treatment or °other patient management decisions. A negative result may occur with  °improper specimen collection/handling, submission of specimen other °than nasopharyngeal swab, presence of viral mutation(s) within the °areas targeted by this assay, and inadequate number of viral °copies(<138 copies/mL). A  negative result must be combined with °clinical observations, patient history, and epidemiological °information. The expected result is Negative. ° °Fact Sheet for Patients:  °https://www.fda.gov/media/152166/download ° °Fact Sheet for Healthcare Providers:  °https://www.fda.gov/media/152162/download ° °This test is no t yet approved or cleared by the [place] FDA and  °has been authorized for detection and/or diagnosis of SARS-CoV-2 by °FDA under an Emergency Use Authorization (EUA). This EUA will remain  °in effect (meaning this test can be used) for the duration of the °COVID-19 declaration under Section 564(b)(1) of the Act, 21 °U.S.C.section 360bbb-3(b)(1), unless the authorization is terminated  °or revoked sooner.  ° ° °  ° Influenza A by   PCR NEGATIVE NEGATIVE Final  ° Influenza B by PCR NEGATIVE NEGATIVE Final  °  Comment: (NOTE) °The Xpert Xpress SARS-CoV-2/FLU/RSV plus assay is intended as an aid °in the diagnosis of influenza from Nasopharyngeal swab specimens and °should not be used as a sole basis for treatment. Nasal washings and °aspirates are unacceptable for Xpert Xpress SARS-CoV-2/FLU/RSV °testing. ° °Fact Sheet for Patients: °https://www.fda.gov/media/152166/download ° °Fact Sheet for Healthcare Providers: °https://www.fda.gov/media/152162/download ° °This test is not yet approved or cleared by the [place] FDA and °has been authorized for detection and/or diagnosis of SARS-CoV-2 by °FDA under an Emergency Use Authorization (EUA). This EUA will remain °in effect (meaning this test can be used) for the duration of the °COVID-19 declaration under Section 564(b)(1) of the Act, 21 U.S.C. °section 360bbb-3(b)(1), unless the authorization is terminated or °revoked. ° °Performed at [facility] [place], [address] [place] °[place], [place] [address] °  ° ° ° °Time coordinating discharge: Over 30 minutes ° °SIGNED: ° ° °Dawood Elgergawy, MD  °[facility] °09/28/2021, 2:15 PM °Pager  ° °If  7PM-7AM, please contact night-coverage [email or web address] °Password [email or web address]  ° °

## 2021-09-28 NOTE — Progress Notes (Signed)
Nutrition Brief Note  RD consulted to assess pt's nutrition status/requirements.  Admitting Dx: Syncope and collapse [R55] Acute ischemic stroke ([place]) [I63.9] Syncope [R55] PMH:  Past Medical History:  Diagnosis Date   Diabetes mellitus without complication ([place])    Hypertension     Medications:  Scheduled Meds:  aspirin EC  81 mg Oral Daily   DULoxetine  20 mg Oral Daily   enoxaparin (LOVENOX) injection  40 mg Subcutaneous Q24H   insulin aspart  0-15 Units Subcutaneous TID WC   lisinopril  10 mg Oral Daily   metoCLOPramide  5 mg Oral TID AC & HS   pantoprazole  40 mg Oral Daily   sodium chloride flush  3 mL Intravenous Q12H  Continuous Infusions:  sodium chloride 100 mL/hr at 09/28/21 1131    Labs: Recent Labs  Lab 09/27/21 1251 09/28/21 0133  NA 126* 134*  K 4.1 3.7  CL 93* 102  CO2 21* 24  BUN 11 9  CREATININE 0.78 0.75  CALCIUM 9.3 8.9  GLUCOSE 244* 118*    Wt Readings from Last 15 Encounters:  09/27/21 62.2 kg    Body mass index is 22.14 kg/m. Patient meets criteria for normal based on current BMI.   Current diet order is heart health/carb modified, patient is consuming approximately 75% of meals at this time.   Noted pt with discharge summary signed and is to d/c home per MD.   No nutrition interventions warranted at this time. If nutrition issues arise, please consult RD.    Theone Stanley., MS, RD, LDN (she/her/hers) RD pager number and weekend/on-call pager number located in [place].

## 2023-01-18 IMAGING — CT CT HEAD W/O CM
3 series · 15 of 47 positions shown, 18 images · non-contrast
Comparison: None.

CLINICAL DATA: Syncope, possible TIA, lightheaded, dizziness



[Series 2: head wo · axial · 0.40mm/px · z∈[-174,-49]mm · 9 of 31 slices shown, 12 images]
[im 3/31  brain]
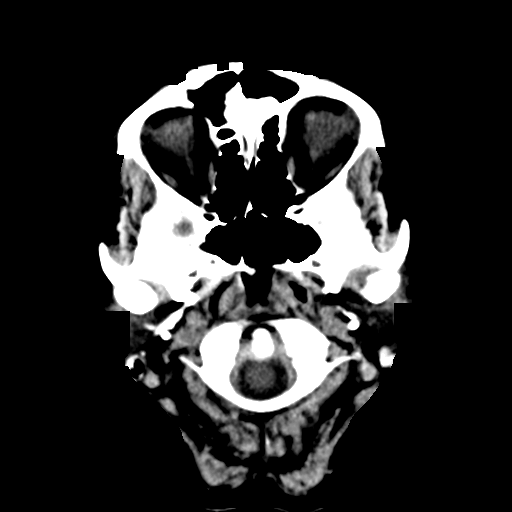
[im 3/31  bone]
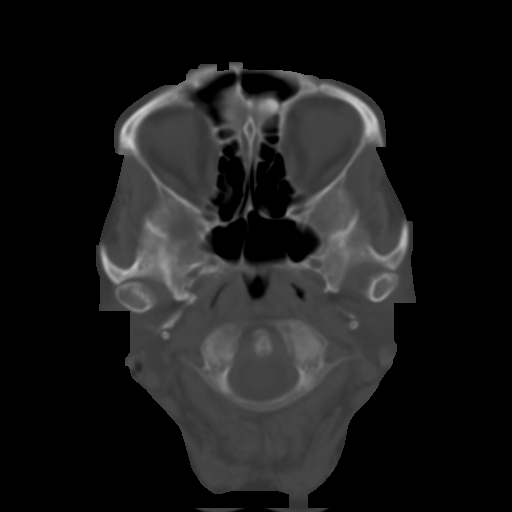
[im 6/31  brain]
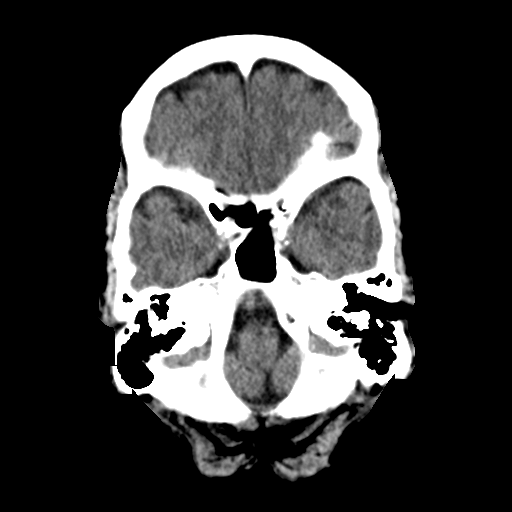
[im 9/31  brain]
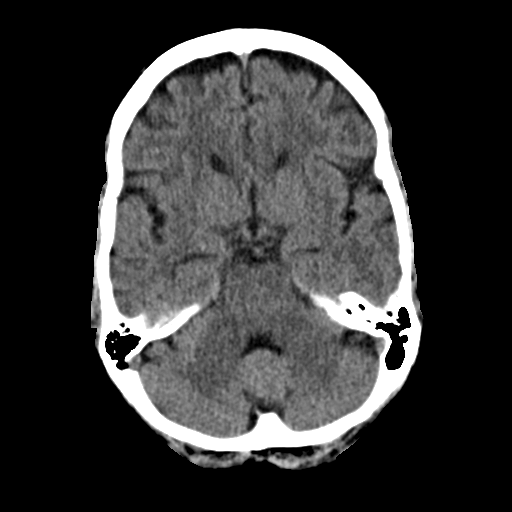
[im 12/31  brain]
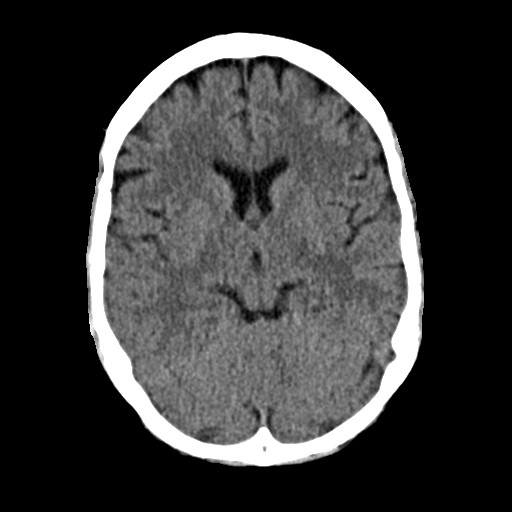
[im 16/31  brain]
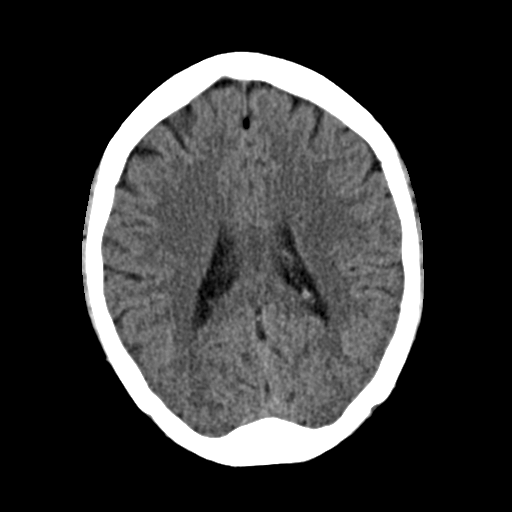
[im 16/31  bone]
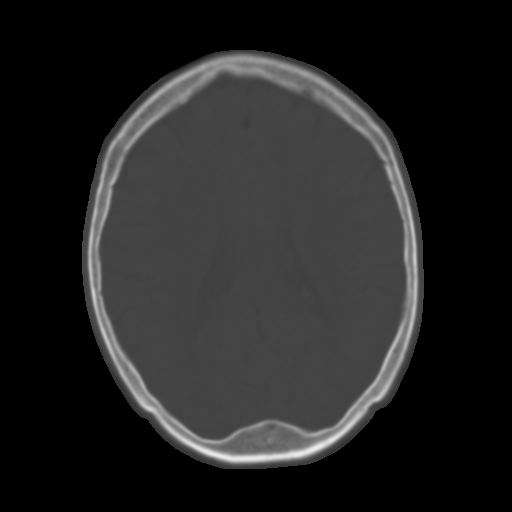
[im 19/31  brain]
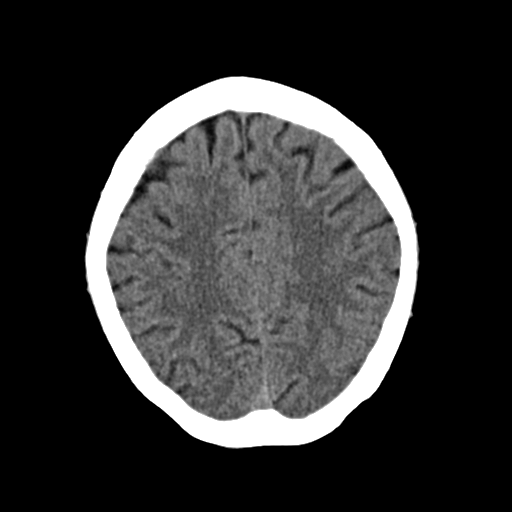
[im 22/31  brain]
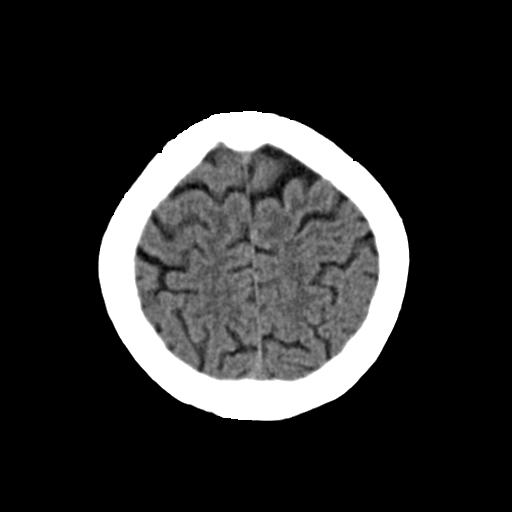
[im 25/31  brain]
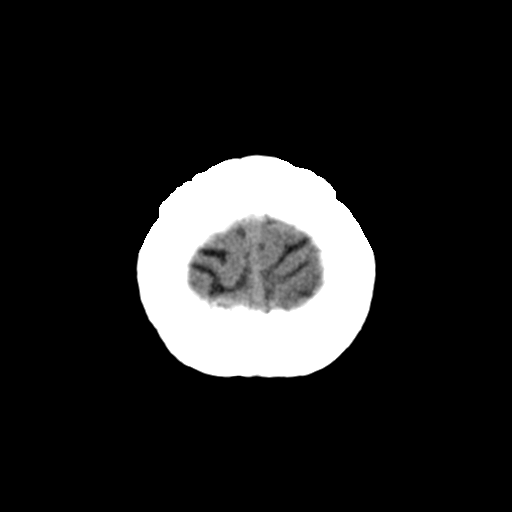
[im 28/31  brain]
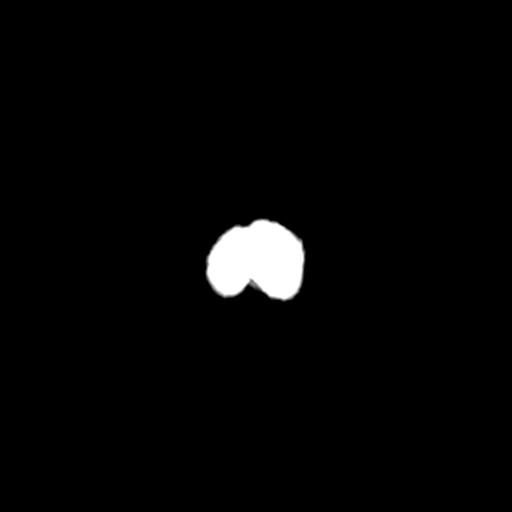
[im 28/31  bone]
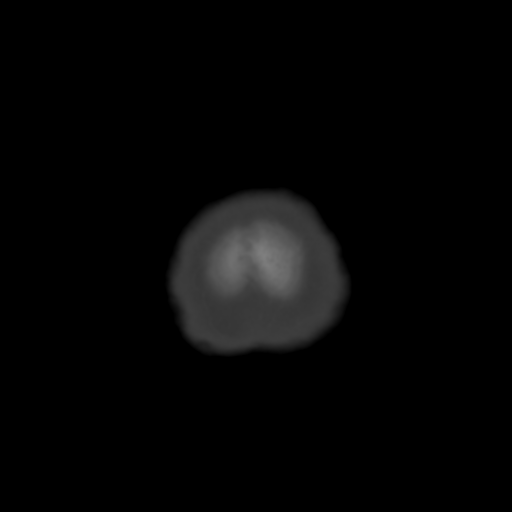

[Series 4: coronal soft · coronal · 0.30mm/px · 3 of 63 slices shown]
[im 21/63  brain]
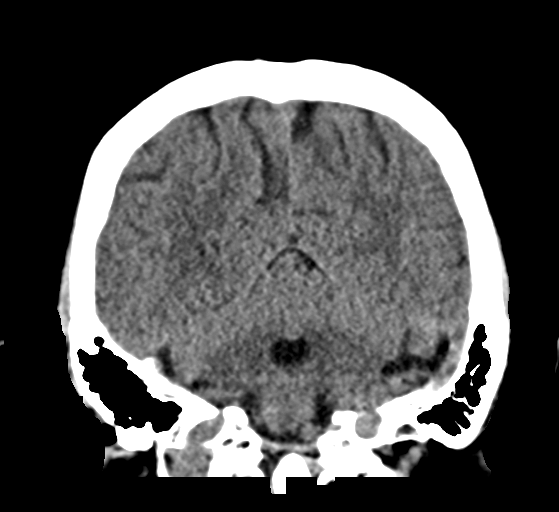
[im 28/63  brain]
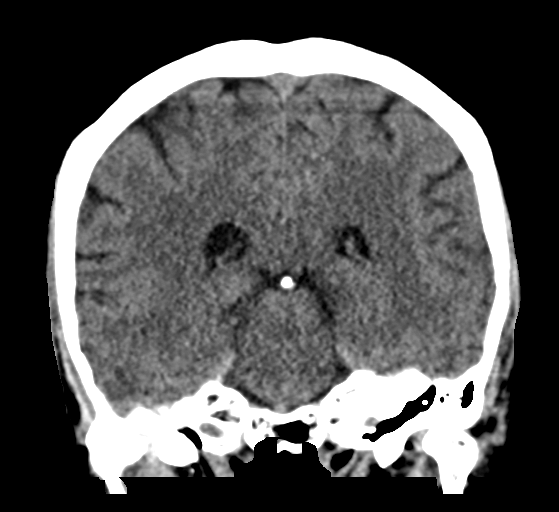
[im 35/63  brain]
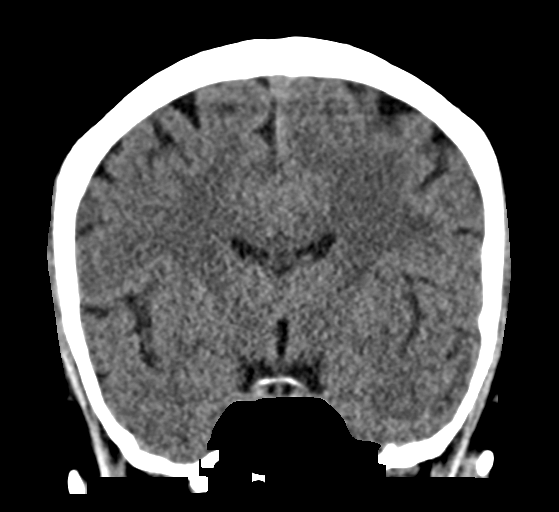

[Series 5: sag soft · sagittal · 0.30mm/px · 3 of 52 slices shown]
[im 18/52  brain]
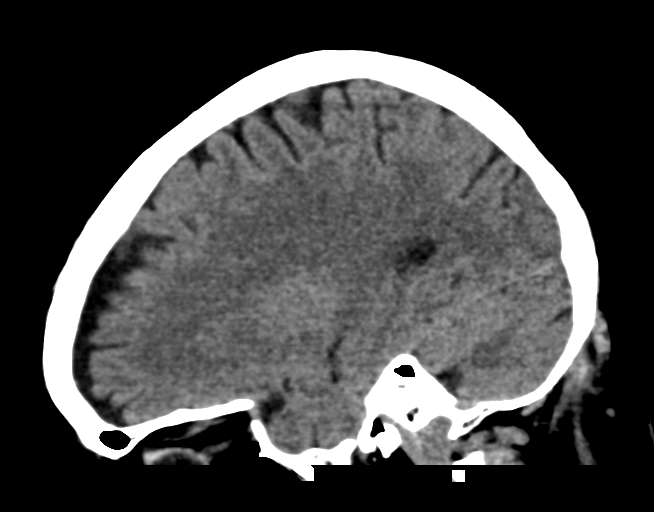
[im 26/52  brain]
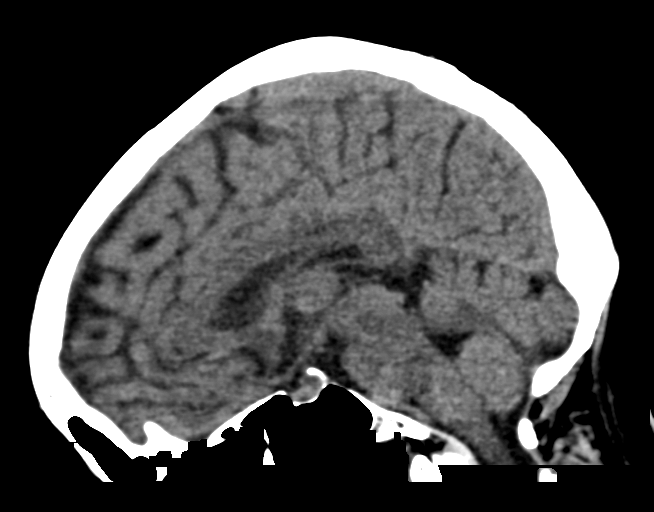
[im 35/52  brain]
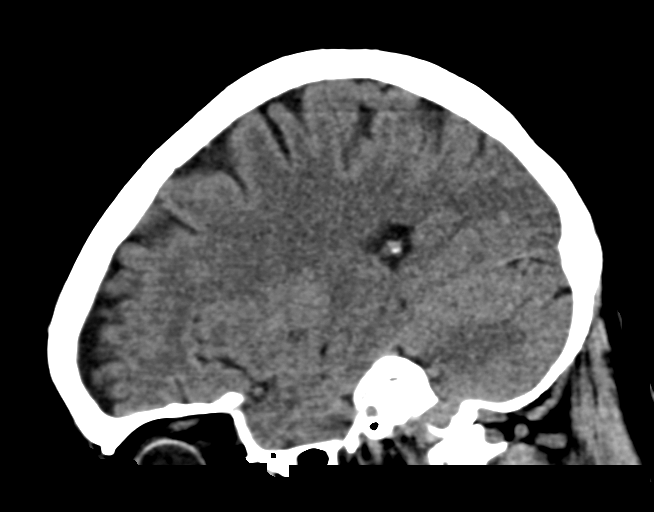

[15 of 47 positions shown; findings below may reference images not displayed]

FINDINGS: Brain: There is no evidence of acute intracranial hemorrhage,
extra-axial fluid collection, or acute infarct.

Parenchymal volume is normal. The ventricles are normal in size.
There is no mass lesion. There is no midline shift.

Vascular: No hyperdense vessel or unexpected calcification.

Skull: Normal. Negative for fracture or focal lesion.

Sinuses/Orbits: A small osteoma is noted in the left frontal sinus.
The imaged paranasal sinuses are otherwise clear. The imaged globes
and orbits are unremarkable.

Other: The mastoid air cells are clear.
IMPRESSION: Normal head CT.
